# Patient Record
Sex: Female | Born: 2000 | Race: White | Hispanic: No | Marital: Single | Smoking: Never smoker
Health system: Southern US, Community
[De-identification: ages and names within clinical notes are randomized; demographics above are authoritative.]

## PROBLEM LIST (undated history)

## (undated) DIAGNOSIS — C3490 Malignant neoplasm of unspecified part of unspecified bronchus or lung: Secondary | ICD-10-CM

## (undated) DIAGNOSIS — F902 Attention-deficit hyperactivity disorder, combined type: Secondary | ICD-10-CM

---
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
## 2019-07-17 ENCOUNTER — Emergency Department (HOSPITAL_COMMUNITY)
Admission: EM | Admit: 2019-07-17 | Discharge: 2019-07-17 | Disposition: A | Discharge status: Home / Self Care | Payer: No Typology Code available for payment source | Attending: Emergency Medicine | Admitting: Emergency Medicine

## 2019-07-17 ENCOUNTER — Emergency Department (HOSPITAL_COMMUNITY): Payer: No Typology Code available for payment source

## 2019-07-17 ENCOUNTER — Encounter (HOSPITAL_COMMUNITY): Payer: Self-pay | Admitting: Emergency Medicine

## 2019-07-17 DIAGNOSIS — S92421A Displaced fracture of distal phalanx of right great toe, initial encounter for closed fracture: Secondary | ICD-10-CM

## 2019-07-17 DIAGNOSIS — S27322A Contusion of lung, bilateral, initial encounter: Secondary | ICD-10-CM | POA: Insufficient documentation

## 2019-07-17 DIAGNOSIS — Z79899 Other long term (current) drug therapy: Secondary | ICD-10-CM | POA: Diagnosis not present

## 2019-07-17 DIAGNOSIS — M542 Cervicalgia: Secondary | ICD-10-CM | POA: Diagnosis not present

## 2019-07-17 DIAGNOSIS — Y999 Unspecified external cause status: Secondary | ICD-10-CM | POA: Diagnosis not present

## 2019-07-17 DIAGNOSIS — J939 Pneumothorax, unspecified: Secondary | ICD-10-CM | POA: Diagnosis not present

## 2019-07-17 DIAGNOSIS — Y939 Activity, unspecified: Secondary | ICD-10-CM | POA: Diagnosis not present

## 2019-07-17 DIAGNOSIS — Y929 Unspecified place or not applicable: Secondary | ICD-10-CM | POA: Diagnosis not present

## 2019-07-17 DIAGNOSIS — T07XXXA Unspecified multiple injuries, initial encounter: Secondary | ICD-10-CM | POA: Diagnosis present

## 2019-07-17 DIAGNOSIS — Z85118 Personal history of other malignant neoplasm of bronchus and lung: Secondary | ICD-10-CM | POA: Diagnosis not present

## 2019-07-17 DIAGNOSIS — R1084 Generalized abdominal pain: Secondary | ICD-10-CM | POA: Diagnosis not present

## 2019-07-17 DIAGNOSIS — S92412A Displaced fracture of proximal phalanx of left great toe, initial encounter for closed fracture: Secondary | ICD-10-CM | POA: Diagnosis not present

## 2019-07-17 DIAGNOSIS — R51 Headache: Secondary | ICD-10-CM | POA: Insufficient documentation

## 2019-07-17 HISTORY — DX: Malignant neoplasm of unspecified part of unspecified bronchus or lung: C34.90

## 2019-07-17 LAB — COMPREHENSIVE METABOLIC PANEL
ALT: 35 U/L (ref 0–44)
AST: 47 U/L — ABNORMAL HIGH (ref 15–41)
Albumin: 4.5 g/dL (ref 3.5–5.0)
Alkaline Phosphatase: 62 U/L (ref 38–126)
Anion gap: 10 (ref 5–15)
BUN: 17 mg/dL (ref 6–20)
CO2: 24 mmol/L (ref 22–32)
Calcium: 9.6 mg/dL (ref 8.9–10.3)
Chloride: 105 mmol/L (ref 98–111)
Creatinine, Ser: 1.12 mg/dL — ABNORMAL HIGH (ref 0.44–1.00)
GFR calc Af Amer: >60 mL/min (ref >60)
GFR calc non Af Amer: >60 mL/min (ref >60)
Glucose, Bld: 134 mg/dL — ABNORMAL HIGH (ref 70–99)
Potassium: 3.8 mmol/L (ref 3.5–5.1)
Sodium: 139 mmol/L (ref 135–145)
Total Bilirubin: 2.1 mg/dL — ABNORMAL HIGH (ref 0.3–1.2)
Total Protein: 7.2 g/dL (ref 6.5–8.1)

## 2019-07-17 LAB — CBC WITH DIFFERENTIAL/PLATELET
Abs Immature Granulocytes: 0.12 10*3/uL — ABNORMAL HIGH (ref 0.00–0.07)
Basophils Absolute: 0.1 10*3/uL (ref 0.0–0.1)
Basophils Relative: 0 %
Eosinophils Absolute: 0.1 10*3/uL (ref 0.0–0.5)
Eosinophils Relative: 1 %
HCT: 45.4 % (ref 36.0–46.0)
Hemoglobin: 15.6 g/dL — ABNORMAL HIGH (ref 12.0–15.0)
Immature Granulocytes: 1 %
Lymphocytes Relative: 7 %
Lymphs Abs: 1.3 10*3/uL (ref 0.7–4.0)
MCH: 33.3 pg (ref 26.0–34.0)
MCHC: 34.4 g/dL (ref 30.0–36.0)
MCV: 97 fL (ref 80.0–100.0)
Monocytes Absolute: 1.1 10*3/uL — ABNORMAL HIGH (ref 0.1–1.0)
Monocytes Relative: 7 %
Neutro Abs: 14.5 10*3/uL — ABNORMAL HIGH (ref 1.7–7.7)
Neutrophils Relative %: 84 %
Platelets: 239 10*3/uL (ref 150–400)
RBC: 4.68 MIL/uL (ref 3.87–5.11)
RDW: 12.8 % (ref 11.5–15.5)
WBC: 17.2 10*3/uL — ABNORMAL HIGH (ref 4.0–10.5)
nRBC: 0 % (ref 0.0–0.2)

## 2019-07-17 LAB — I-STAT BETA HCG BLOOD, ED (MC, WL, AP ONLY): I-stat hCG, quantitative: <5 m[IU]/mL (ref <5)

## 2019-07-17 MED ORDER — MORPHINE SULFATE (PF) 4 MG/ML IV SOLN
4.0000 mg | Freq: Once | INTRAVENOUS | Status: AC
  Administered 2019-07-17: 4 mg via INTRAVENOUS
  Filled 2019-07-17: qty 1

## 2019-07-17 MED ORDER — ONDANSETRON HCL 4 MG/2ML IJ SOLN
4.0000 mg | Freq: Once | INTRAMUSCULAR | Status: AC
  Administered 2019-07-17: 16:00:00 4 mg via INTRAVENOUS
  Filled 2019-07-17: qty 2

## 2019-07-17 MED ORDER — MORPHINE SULFATE (PF) 4 MG/ML IV SOLN
4.0000 mg | Freq: Once | INTRAVENOUS | Status: AC
  Administered 2019-07-17: 12:00:00 4 mg via INTRAVENOUS
  Filled 2019-07-17: qty 1

## 2019-07-17 MED ORDER — ONDANSETRON HCL 4 MG/2ML IJ SOLN
4.0000 mg | Freq: Once | INTRAMUSCULAR | Status: AC
  Administered 2019-07-17: 4 mg via INTRAVENOUS
  Filled 2019-07-17: qty 2

## 2019-07-17 MED ORDER — LORAZEPAM 2 MG/ML IJ SOLN
1.0000 mg | Freq: Once | INTRAMUSCULAR | Status: AC
  Administered 2019-07-17: 1 mg via INTRAVENOUS
  Filled 2019-07-17: qty 1

## 2019-07-17 MED ORDER — IOHEXOL 300 MG/ML  SOLN
100.0000 mL | Freq: Once | INTRAMUSCULAR | Status: AC | PRN
  Administered 2019-07-17: 100 mL via INTRAVENOUS
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
## 2019-07-17 NOTE — ED Notes (Signed)
Patient transported to X-ray 

## 2019-07-17 NOTE — ED Notes (Signed)
Lac to right leg
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
## 2019-07-17 NOTE — Discharge Instructions (Signed)
Use incentive spirometer as directed. Take Motrin and Tylenol as needed as directed for pain. Weight-bear as tolerated in regards to your broken toe.  Elevate the foot and apply ice for 30 minutes at a time 3 times daily. Follow-up with your doctor for recheck on Monday. Return to the emergency room at anytime for any worsening or concerning symptoms including abdominal pain, vomiting, blood in your urine, shortness of breath or difficulty breathing or fevers.
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
## 2019-07-17 NOTE — ED Notes (Signed)
Pt returned from xray
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
## 2021-03-30 NOTE — ED Provider Notes (Signed)
 [facility]  ED Provider Note  Alyssah Algeo 20 y.o. female DOB: 16-Feb-2001 MRN: [id number] History   Chief Complaint  Patient presents with  . Dog Bite    Pt reports she was bit by a stray dog a couple of hours ago. 2 punctures noted to left index finger and 1 puncure in between right thumb and index finger.    History provided by:  Patient and medical records (mother came in around time of d/c)  Onset/Timeline: PTA Context: bit by an unknown dog while trying to feed it. No contact with animal control prior to ED visit. No longer immunosuppressed. Release from care by [facility]. Just on routine imaging protocol to detect any early recurrence.  Quality: throbbing, with mild oozing. Improves with: nothing Worse with: time, palp and movement Severity: mod Associated Symptoms: see ROS  Past Medical History:  Diagnosis Date  . Allergic rhinitis, cause unspecified   . Attention deficit disorder with hyperactivity(314.01)   . Cellulitis and abscess of leg, except foot   . Ewing sarcoma (*)    in remission, treated at [facility]    History reviewed. No pertinent surgical history.  Social History   Substance and Sexual Activity  Alcohol Use No   Social History   Tobacco Use  Smoking Status Never Smoker  Smokeless Tobacco Never Used   E-Cigarettes  . Vaping Use    . Start Date    . Cartridges/Day    . Quit Date     Social History   Substance and Sexual Activity  Drug Use Yes  . Types: Marijuana   Immunizations    Name Date Dose VIS Date Route   DTaP 01/04/2004 -- -- --   DTaP 04/18/2000 -- -- --   DTaP 06/27/1999 -- -- --   HIB 04/18/2000 -- -- --   HIB 06/27/1999 -- -- --   HIB 04/20/1999 -- -- --   HIB 02/16/1999 -- -- --   Hep B Pediatric/Adolescent (2 or 3 dose series) 08/03/2000 -- -- --   Hep B Pediatric/Adolescent (2 or 3 dose series) 10/17/1999 -- -- --   Hep B Pediatric/Adolescent (2 or 3 dose series) 06/27/1999 -- -- --   Hepatitis A  Pediatric 01/29/2010 -- -- --   Hepatitis A Pediatric 03/11/2008 -- -- --   IPV 01/17/2000 -- -- --   IPV 10/17/1999 -- -- --   IPV 04/20/1999 -- -- --   IPV 02/16/1999 -- -- --   Influenza Nasal Tri 10/31/2011 0.25 mL 04/22/2011 Intranasal   Site: Intranasal   Given By: Rea DELENA Sar, MA   Manufacturer: [employer]   Lot: [id number]   MMR 01/04/2004 -- -- --   MMR 01/17/2000 -- -- --   Meningococcal Conjugate 10/31/2011 0.5 mL 11/17/2006 Intramuscular   Site: Left deltoid   Given By: Rea DELENA Sar, MA   Manufacturer: [employer]   Lot: [id number]   Pfizer-BioNTech COVID-19 Vaccine mRNA 02/14/2020 -- -- --   Lot: [id number]   External: Auto Reconciled From Outside Source   Pneumococcal Conjugate 12/30/2000 -- -- --   Pneumococcal Conjugate 06/27/1999 -- -- --   Pneumococcal Conjugate 04/20/1999 -- -- --   Pneumococcal Conjugate 02/16/1999 -- -- --   Rabies  Incomplete 2.5 Units 03/22/2021 Intramuscular   Manufacturer: Sanofi Pasteur   Rabies  Incomplete 20 Units/kg 03/22/2021 Intramuscular   Rabies Immune Globulin  Incomplete -- 07/26/2008 --   Tdap  Incomplete 0.5 mL 05/26/2020 Intramuscular  Tdap 01/29/2010 -- -- --   Varicella 03/11/2008 -- -- --   Varicella 01/17/2000 -- -- --            Allergies  Allergen Reactions  . Pedi-Pre Tape Spray [Wound Dressing Adhesive] Rash  . Silver Rash    Use IV 3000 Use IV 3000 Use IV 3000   . Vancomycin Rash    Red man Itching (head, neck and throat) and generalized redness Itching (head, neck and throat) and generalized redness Itching (head, neck and throat) and generalized redness   . Dronabinol Anxiety    Other reaction(s): Anxiety Hallucinations Hallucinations Hallucinations Hallucinations Hallucinations     Discharge Medication List as of 03/30/2021  8:55 AM    CONTINUE these medications which have NOT CHANGED   Details  diphenhydrAMINE  (BENYLIN ) 12.5 MG/5ML liquid Take by mouth 4 (four) times daily as needed., Until Discontinued, Historical  Med    fluticasone (FLONASE) 50 MCG/ACT nasal spray 1 spray per nostril once daily in the morning.  May increase to twice daily for 2 weeks when symptoms are worse., Normal    IBUPROFEN  CHILDRENS PO Take by mouth., Historical Med    methylphenidate  (METADATE  CD) 50 MG CR capsule Take 1 capsule (50 mg total) by mouth every morning., Starting 10/07/2012, Until Discontinued, Print    sertraline  (ZOLOFT ) 25 MG tablet Take 25 mg by mouth daily., Until Discontinued, Historical Med        Review of Systems   Review of Systems  Constitutional: Negative for fever.  Respiratory: Negative for shortness of breath.   Cardiovascular: Negative for chest pain.  Gastrointestinal: Positive for nausea (in the setting of pain). Negative for abdominal pain.  Musculoskeletal: Negative for arthralgias and joint swelling.       R index finger has swelling  Skin: Positive for wound (x3).  Allergic/Immunologic: Negative for immunocompromised state.  Neurological: Negative for syncope.  Hematological: Does not bruise/bleed easily.  Psychiatric/Behavioral: Negative for confusion.     Physical Exam   ED Triage Vitals [03/30/21 0134]  BP (!) 146/107  Heart Rate 100  Resp 18  SpO2 96 %  Temp 98.5 F (36.9 C)    Physical Exam  Constitutional: She appears well-developed and well-nourished. She is in good hygiene. She has good hygiene. She does not appear distressed, does not appear ill and no respiratory distress. Not diaphoretic. Calm, cooperative, pleasant In pain only when wound is being evaluated, otherwise in good spirits  HENT:  Head: Normocephalic.  Right Ear: Normal external ear.  Left Ear: Normal external ear.  Mouth/Throat: Voice normal.  Eyes: Right eye: no drainage. Left eye: no drainage.  Neck: Normal range of motion and voice normal. Neck supple. Normal range of motion.  Cardiovascular: Normal rate and regular rhythm.  Pulmonary/Chest: No respiratory distress. Not tachypneic.  Respiratory effort normal.  Musculoskeletal: No obvious deformity noted to extremities.     Cervical back: Normal range of motion and neck supple. Normal range of motion.       Hands:   Neurological: She is alert. Moves all extremities equally. She has normal speech.  Skin: Skin is warm. Not diaphoretic. Skin is dry.  Psychiatric: Her behavior is normal.   R UPPER EXTREMITY EXAM:  INSPECTION, ALIGNMENT & PALPATION: No gross deformity. 3 open wounds--see above. Mild to mod swelling along proximal phalanx of 2nd digit. No ecchymosis. No masses. No crepitance. No effusion. Mod tenderness to palpation along wounds and area of swelling only.  ROM: Decreased ROM of IP  and MCP due to pain. Intact AROM and PROM of other fingers, wrist, elbow and shoulder.  SENSORY: sensation is intact to light touch in:  superficial radial nerve distribution (dorsal first web space) median nerve distribution (tip of index finger) ulnar nerve distribution (tip of small finger) Decreased SILT mildly to distal index diffusely.   MOTOR:  + motor posterior interosseous nerve (thumb IP extension) + anterior interosseous nerve (thumb IP flexion, index finger DIP flexion) + radial nerve (wrist extension) + median nerve (palpable firing thenar mass) + ulnar nerve (palpable firing of first dorsal interosseous muscle)  Intact flexion of 1st and 2nd digit at IP and MCP joints. Intact extension along both fingers as well.   VASCULAR: 2+ radial pulse, brisk capillary refill < 2 sec, fingers warm and well-perfused  COMPARTMENTS: Soft and compressible.   ED Course   PT with PMHx and PSHx as above presents for evaluation after a dog bite.   DDx includes fracture vs contusion in addition to laceration.   STUDIES:  Orders Placed This Encounter  Procedures  . Xr Hand Min 3 Views Right  . Rabies Immune Globulin  . Provide patient with Vaccine Information Sheet (VIS) for Rabies vaccine  . Provide patient with  discharge instruction with patient information regarding follow-up appointments, date(s) for subsequent doses and where patient is to return for these subsequent doses.  SABRA Splint Application (specify site)  . Provide patient with Vaccine Information Sheet (VIS) for Rabies vaccine  . Provide patient with discharge instruction with patient information regarding follow-up appointments, date(s) for subsequent doses and where patient is to return for these subsequent doses.    Highlighted Results  LABS: IMAGING: ECG:  Labs Reviewed - No data to display Xr Hand Min 3 Views Right  Final Result  IMPRESSION:   No acute osseous abnormalities.            Electronically Signed by: Marinda Fleming on 03/30/2021 3:52 AM       ECG Results   None      Pre-Sedation  Procedures    TREATMENT:  Medications  rabies immune globulin (HYPERRAB) injection 1,125 Units (1,125 Units Infiltration Given 03/30/21 0838)  Tdap (BOOSTRIX) injection 0.5 mL (0.5 mLs Intramuscular Given 03/30/21 1001)  oxyCODONE HCl (ROXICODONE) immediate release tablet 5 mg (5 mg Oral Given 03/30/21 0725)  ibuprofen  (ADVIL ,MOTRIN ) tablet 400 mg (400 mg Oral Given 03/30/21 0725)  amoxicillin-clavulanate (AUGMENTIN) 875-125 mg per tablet 1 tablet (1 tablet Oral Given 03/30/21 0724)  fentaNYL citrate (SUBLIMAZE) injection 25 mcg (25 mcg IntraVENous Given 03/30/21 0836)  ampicillin-sulbactam (UNASYN) 3 g in NaCl 0.9% 100 mL Vial2Bag (0 g IntraVENous Stopped 03/30/21 1044)  rabies vaccine, PCEC (RABAVERT) injection 1 mL (1 mL Intramuscular Given 03/30/21 0847)    Patient response to treatment: stable  MDM  Unknown dog so will initiate rabies PPX. Recommended she call animal control to get assistance with trapping dog since she reports it has been seen in the neighborhood before. If it is caught and neg for rabies, can stop rabies PPX.  Wound was irrigated by CNA. Will leave open since there is no cosmesis concern and risk of  infection is high. Will simply bring edges together with steristrips.   No fracture  Consult: d/w Dr. Georgina, ortho at [facility], who agreed with plan to start abx and not close wounds after irrigation. Did recommended IV dose of Unasyn. He agrees with plan for close follow up to monitor for infection.   Will immobilize finger  in the meantime to prevent wound disruption along 2nd digit.   PT agrees with plan to let wound heal by 2ndary intention due to high risk of infection with closure.   Abx PPx started.   Of note, favor numbness to be due to swelling and not from nerve injury since wound is primarily on radial side but pt reports diffuse numbness.   Treatment plan ordered for [facility] OP infusion center and pt was given paper rx in case she wishes to get immunization at another location.   Recommended follow up as documented below. Discharged with the following prescriptions: see below. [place] PDMP was reviewed and no red flags were identified. PT was educated on the addiction risks of narcotics and how to take them safely. A hand out summarized these instructions were given in the AVS. They stated understanding. They accept and will mitigate the risks as instructed. Recommended using NSAIDs and RICE as first line therapy.  Pt was provided written discharge instructions.  Additional instructions were given and discussed including, but not limited to, discussing returning to the ED if she developed Kanavel's signs--reviewed them in non medical jargon. Pt stated understanding. Also reviewed basic wound care/monitoring.  Discussed expected course related to their diagnosis. Concerning symptoms that warranted immediate return to the ED were highlighted, including new or worsening symptoms. Pt was also informed they could return as needed. All parties were in agreement, endorsed understanding, and all questions were answered to their satisfaction at time of disposition.  Coding  Provider  Communication  Clinical Impression   Final diagnoses:  Dog bite, initial encounter    ED Disposition    ED Disposition  Discharge   Condition  Stable   Comment  --        Discharge Medication List as of 03/30/2021  8:55 AM    START taking these medications   Details  amoxicillin-clavulanate (AUGMENTIN) 875-125 mg per tablet Take one tablet by mouth every 12 (twelve) hours for 7 days., Starting Fri 03/30/2021, Until Fri 04/06/2021, Normal    oxyCODONE HCl (ROXICODONE) 5 mg immediate release tablet Take one tablet (5 mg dose) by mouth every 6 (six) hours as needed for Pain for up to 10 doses., Starting Fri 03/30/2021, Until Tue 04/03/2021 at 2359, Normal    rabies vaccine,human diploid (IMOVAX) injection Inject 1 mL into the muscle once for 1 dose. Day 3 dosing. For children ages 46-18 and adults, administer vaccine in the deltoid muscle; for children less than age 75, administer vaccine in the anterolateral aspect of the thigh. Administer vaccine doses on  the following days: Day 0, Day 3   If Imovax unavailable may substitute RabAvert (GSK), Starting Mon 04/02/2021, Print              Follow-up Information    Go to  [facility].   Specialty: Emergency Medicine Comments: If symptoms worsen Contact information: [address] [facility] [place]  [address] [phone]       Lavelle JAYSON Ada, MD. Call today.   Specialty: Orthopaedic Surgery Comments: For a follow up appointment next week Contact information: [address] [place] [address] [phone]        Call the infusion center on Monday to get further information on getting your vaccine.                Electronically signed by:   Morton KATHEE Press, MD 03/31/21 1757
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
## 2024-05-13 ENCOUNTER — Encounter (HOSPITAL_COMMUNITY): Payer: Self-pay | Admitting: Emergency Medicine

## 2024-05-13 ENCOUNTER — Emergency Department (HOSPITAL_COMMUNITY)

## 2024-05-13 ENCOUNTER — Emergency Department (HOSPITAL_COMMUNITY)
Admission: EM | Admit: 2024-05-13 | Discharge: 2024-05-13 | Disposition: A | Discharge status: Home / Self Care | Attending: Emergency Medicine | Admitting: Emergency Medicine

## 2024-05-13 ENCOUNTER — Other Ambulatory Visit: Payer: Self-pay

## 2024-05-13 DIAGNOSIS — R55 Syncope and collapse: Secondary | ICD-10-CM | POA: Diagnosis not present

## 2024-05-13 DIAGNOSIS — R42 Dizziness and giddiness: Secondary | ICD-10-CM | POA: Insufficient documentation

## 2024-05-13 DIAGNOSIS — R0602 Shortness of breath: Secondary | ICD-10-CM | POA: Insufficient documentation

## 2024-05-13 LAB — COMPREHENSIVE METABOLIC PANEL WITH GFR
ALT: 14 U/L (ref 0–44)
AST: 18 U/L (ref 15–41)
Albumin: 4.1 g/dL (ref 3.5–5.0)
Alkaline Phosphatase: 59 U/L (ref 38–126)
Anion gap: 8 (ref 5–15)
BUN: 19 mg/dL (ref 6–20)
CO2: 21 mmol/L — ABNORMAL LOW (ref 22–32)
Calcium: 8.9 mg/dL (ref 8.9–10.3)
Chloride: 107 mmol/L (ref 98–111)
Creatinine, Ser: 1.03 mg/dL — ABNORMAL HIGH (ref 0.44–1.00)
GFR, Estimated: >60 mL/min (ref >60)
Glucose, Bld: 94 mg/dL (ref 70–99)
Potassium: 3.6 mmol/L (ref 3.5–5.1)
Sodium: 136 mmol/L (ref 135–145)
Total Bilirubin: 1.5 mg/dL — ABNORMAL HIGH (ref 0.0–1.2)
Total Protein: 7.2 g/dL (ref 6.5–8.1)

## 2024-05-13 LAB — CBC WITH DIFFERENTIAL/PLATELET
Abs Immature Granulocytes: 0.03 K/uL (ref 0.00–0.07)
Basophils Absolute: 0 K/uL (ref 0.0–0.1)
Basophils Relative: 1 %
Eosinophils Absolute: 0.2 K/uL (ref 0.0–0.5)
Eosinophils Relative: 2 %
HCT: 47.2 % — ABNORMAL HIGH (ref 36.0–46.0)
Hemoglobin: 15.3 g/dL — ABNORMAL HIGH (ref 12.0–15.0)
Immature Granulocytes: 0 %
Lymphocytes Relative: 20 %
Lymphs Abs: 1.3 K/uL (ref 0.7–4.0)
MCH: 31.7 pg (ref 26.0–34.0)
MCHC: 32.4 g/dL (ref 30.0–36.0)
MCV: 97.9 fL (ref 80.0–100.0)
Monocytes Absolute: 0.5 K/uL (ref 0.1–1.0)
Monocytes Relative: 8 %
Neutro Abs: 4.7 K/uL (ref 1.7–7.7)
Neutrophils Relative %: 69 %
Platelets: 127 K/uL — ABNORMAL LOW (ref 150–400)
RBC: 4.82 MIL/uL (ref 3.87–5.11)
RDW: 12.9 % (ref 11.5–15.5)
Smear Review: NORMAL
WBC: 6.7 K/uL (ref 4.0–10.5)
nRBC: 0 % (ref 0.0–0.2)

## 2024-05-13 NOTE — ED Triage Notes (Deleted)
 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 EDP at bedside

## 2024-05-13 NOTE — ED Provider Notes (Signed)
 [name] EMERGENCY DEPARTMENT AT [facility] Provider Note   CSN: [id number] Arrival date & time: 05/13/24  9070     Patient presents with: Shortness of Breath   Carol [name] is a 23 y.o. female.   Patient states that she had surgery on her lung in 2017 and ever since then when she bends over she noticed some discomfort on that left side and becomes dizzy and almost blacks out.  She has been seen for this before but she was told it would eventually improve.  Patient has a history of sarcoma.  The history is provided by the patient and medical records. No language interpreter was used.  Shortness of Breath Severity:  Mild Onset quality:  Sudden Timing:  Intermittent Progression:  Waxing and waning Chronicity:  New Context: activity   Relieved by:  Nothing Worsened by:  Nothing Associated symptoms: no abdominal pain, no chest pain, no cough, no headaches and no rash        Prior to Admission medications   Medication Sig Start Date End Date Taking? Authorizing Provider  busPIRone  (BUSPAR ) 10 MG tablet Take one tablet two times a day for anxiety. 06/28/14   Rochel Aureliano DASEN, PA-C  famotidine (PEPCID) 20 MG tablet Take 20 mg by mouth daily.    [provider]  ibuprofen  (ADVIL ,MOTRIN ) 100 MG/5ML suspension Take by mouth.    [provider]  lisdexamfetamine (VYVANSE ) 20 MG capsule Take 1 capsule (20 mg total) by mouth daily. 06/28/14   Rochel Aureliano DASEN, PA-C    Allergies: Tape and Vancomycin    Review of Systems  Constitutional:  Negative for appetite change and fatigue.  HENT:  Negative for congestion, ear discharge and sinus pressure.   Eyes:  Negative for discharge.  Respiratory:  Positive for shortness of breath. Negative for cough.   Cardiovascular:  Negative for chest pain.  Gastrointestinal:  Negative for abdominal pain and diarrhea.  Genitourinary:  Negative for frequency and hematuria.  Musculoskeletal:  Negative for back pain.  Skin:   Negative for rash.  Neurological:  Negative for seizures and headaches.  Psychiatric/Behavioral:  Negative for hallucinations.     Updated Vital Signs BP 112/80   Pulse 72   Temp 98.2 F (36.8 C) (Oral)   Resp 18   Ht 5' 4 (1.626 m)   Wt 70.3 kg   SpO2 99%   BMI 26.61 kg/m   Physical Exam Vitals and nursing note reviewed.  Constitutional:      Appearance: She is well-developed.  HENT:     Head: Normocephalic.     Right Ear: External ear normal.     Mouth/Throat:     Mouth: Mucous membranes are moist.  Eyes:     General: No scleral icterus.    Conjunctiva/sclera: Conjunctivae normal.  Neck:     Thyroid: No thyromegaly.  Cardiovascular:     Rate and Rhythm: Normal rate and regular rhythm.     Heart sounds: No murmur heard.    No friction rub. No gallop.  Pulmonary:     Breath sounds: No stridor. No wheezing or rales.  Chest:     Chest wall: No tenderness.  Abdominal:     General: There is no distension.     Tenderness: There is no abdominal tenderness. There is no rebound.  Musculoskeletal:        General: Normal range of motion.     Cervical back: Neck supple.  Lymphadenopathy:     Cervical: No cervical  adenopathy.  Skin:    Findings: No erythema or rash.  Neurological:     Mental Status: She is alert and oriented to person, place, and time.     Motor: No abnormal muscle tone.     Coordination: Coordination normal.  Psychiatric:        Behavior: Behavior normal.     (all labs ordered are listed, but only abnormal results are displayed) Labs Reviewed  CBC WITH DIFFERENTIAL/PLATELET - Abnormal; Notable for the following components:      Result Value   Hemoglobin 15.3 (*)    HCT 47.2 (*)    Platelets 127 (*)    All other components within normal limits  COMPREHENSIVE METABOLIC PANEL WITH GFR - Abnormal; Notable for the following components:   CO2 21 (*)    Creatinine, Ser 1.03 (*)    Total Bilirubin 1.5 (*)    All other components within normal  limits    EKG: None  Radiology: DG Chest 2 View Result Date: 05/13/2024 CLINICAL DATA:  weak EXAM: CHEST - 2 VIEW COMPARISON:  July 17, 2019 FINDINGS: No focal airspace consolidation, pleural effusion, or pneumothorax. No cardiomegaly. No acute fracture or destructive lesion. IMPRESSION: No acute cardiopulmonary abnormality. Electronically Signed   By: Rogelia Myers M.D.   On: 05/13/2024 11:04     Procedures   Medications Ordered in the ED - No data to display                                  Medical Decision Making Amount and/or Complexity of Data Reviewed Labs: ordered. Radiology: ordered.  Patient with near syncopal episodes.  She will follow-up with pulmonary to see if there is anything they can do about this.     Final diagnoses:  Shortness of breath    ED Discharge Orders     None          Suzette Pac, MD 05/16/24 1224

## 2024-05-13 NOTE — ED Triage Notes (Signed)
 Pt states she is missing one lobe of her lung on thel ft side and recently she feels like it is collapsing whenever she bends over and then she blacks out. EDP at bedside

## 2024-05-13 NOTE — Discharge Instructions (Signed)
Follow up with your pulmonologist as planned
